# Patient Record
Sex: Male | Born: 2005 | Race: White | Hispanic: Yes | Marital: Single | Smoking: Never smoker
Health system: Southern US, Community
[De-identification: ages and names within clinical notes are randomized; demographics above are authoritative.]

## PROBLEM LIST (undated history)

## (undated) DIAGNOSIS — Z789 Other specified health status: Secondary | ICD-10-CM

## (undated) DIAGNOSIS — J45909 Unspecified asthma, uncomplicated: Secondary | ICD-10-CM

---
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
## 2024-01-06 ENCOUNTER — Telehealth: Payer: Self-pay

## 2024-01-06 NOTE — Telephone Encounter (Signed)
 Ok to establish

## 2024-01-06 NOTE — Telephone Encounter (Signed)
 Patient's mother [name] [name], current Dr. Mahlon patient, would like to see if she would be willing to accept her son, [name] [name], as a new patient

## 2024-01-06 NOTE — Telephone Encounter (Signed)
 Please call patient to inform them and to scheduled appointment

## 2024-01-07 NOTE — Telephone Encounter (Signed)
 Called and got patient scheduled for NP appt

## 2024-02-29 ENCOUNTER — Other Ambulatory Visit (INDEPENDENT_AMBULATORY_CARE_PROVIDER_SITE_OTHER): Payer: Self-pay | Admitting: Pediatric Gastroenterology

## 2024-02-29 DIAGNOSIS — R1084 Generalized abdominal pain: Secondary | ICD-10-CM

## 2024-03-02 ENCOUNTER — Ambulatory Visit: Admitting: Dermatology

## 2024-03-14 ENCOUNTER — Ambulatory Visit: Admitting: Family Medicine
